# Patient Record
Sex: Male | Born: 1995 | Hispanic: Yes | Marital: Single | Smoking: Never smoker
Health system: Southern US, Community
[De-identification: ages and names within clinical notes are randomized; demographics above are authoritative.]

---

## 2019-05-27 ENCOUNTER — Emergency Department (HOSPITAL_COMMUNITY): Payer: No Typology Code available for payment source

## 2019-05-27 ENCOUNTER — Other Ambulatory Visit: Payer: Self-pay

## 2019-05-27 ENCOUNTER — Encounter (HOSPITAL_COMMUNITY): Payer: Self-pay | Admitting: Emergency Medicine

## 2019-05-27 ENCOUNTER — Emergency Department (HOSPITAL_COMMUNITY)
Admission: EM | Admit: 2019-05-27 | Discharge: 2019-05-27 | Disposition: A | Discharge status: Home / Self Care | Payer: No Typology Code available for payment source | Attending: Emergency Medicine | Admitting: Emergency Medicine

## 2019-05-27 DIAGNOSIS — Y999 Unspecified external cause status: Secondary | ICD-10-CM | POA: Diagnosis not present

## 2019-05-27 DIAGNOSIS — R404 Transient alteration of awareness: Secondary | ICD-10-CM | POA: Insufficient documentation

## 2019-05-27 DIAGNOSIS — Y939 Activity, unspecified: Secondary | ICD-10-CM | POA: Insufficient documentation

## 2019-05-27 DIAGNOSIS — R040 Epistaxis: Secondary | ICD-10-CM | POA: Diagnosis not present

## 2019-05-27 DIAGNOSIS — S81012A Laceration without foreign body, left knee, initial encounter: Secondary | ICD-10-CM

## 2019-05-27 DIAGNOSIS — Y929 Unspecified place or not applicable: Secondary | ICD-10-CM | POA: Insufficient documentation

## 2019-05-27 DIAGNOSIS — Z23 Encounter for immunization: Secondary | ICD-10-CM | POA: Diagnosis not present

## 2019-05-27 DIAGNOSIS — S82002A Unspecified fracture of left patella, initial encounter for closed fracture: Secondary | ICD-10-CM | POA: Diagnosis not present

## 2019-05-27 LAB — COMPREHENSIVE METABOLIC PANEL
ALT: 22 U/L (ref 0–44)
AST: 24 U/L (ref 15–41)
Albumin: 4.4 g/dL (ref 3.5–5.0)
Alkaline Phosphatase: 66 U/L (ref 38–126)
Anion gap: 11 (ref 5–15)
BUN: 19 mg/dL (ref 6–20)
CO2: 20 mmol/L — ABNORMAL LOW (ref 22–32)
Calcium: 9.2 mg/dL (ref 8.9–10.3)
Chloride: 106 mmol/L (ref 98–111)
Creatinine, Ser: 1.18 mg/dL (ref 0.61–1.24)
GFR calc Af Amer: >60 mL/min (ref >60)
GFR calc non Af Amer: >60 mL/min (ref >60)
Glucose, Bld: 99 mg/dL (ref 70–99)
Potassium: 3.7 mmol/L (ref 3.5–5.1)
Sodium: 137 mmol/L (ref 135–145)
Total Bilirubin: 1.3 mg/dL — ABNORMAL HIGH (ref 0.3–1.2)
Total Protein: 7.1 g/dL (ref 6.5–8.1)

## 2019-05-27 LAB — CBC WITH DIFFERENTIAL/PLATELET
Abs Immature Granulocytes: 0.03 10*3/uL (ref 0.00–0.07)
Basophils Absolute: 0 10*3/uL (ref 0.0–0.1)
Basophils Relative: 0 %
Eosinophils Absolute: 0.1 10*3/uL (ref 0.0–0.5)
Eosinophils Relative: 1 %
HCT: 42.9 % (ref 39.0–52.0)
Hemoglobin: 14.6 g/dL (ref 13.0–17.0)
Immature Granulocytes: 0 %
Lymphocytes Relative: 26 %
Lymphs Abs: 2.6 10*3/uL (ref 0.7–4.0)
MCH: 29.1 pg (ref 26.0–34.0)
MCHC: 34 g/dL (ref 30.0–36.0)
MCV: 85.6 fL (ref 80.0–100.0)
Monocytes Absolute: 0.8 10*3/uL (ref 0.1–1.0)
Monocytes Relative: 8 %
Neutro Abs: 6.5 10*3/uL (ref 1.7–7.7)
Neutrophils Relative %: 65 %
Platelets: 292 10*3/uL (ref 150–400)
RBC: 5.01 MIL/uL (ref 4.22–5.81)
RDW: 12.8 % (ref 11.5–15.5)
WBC: 10 10*3/uL (ref 4.0–10.5)
nRBC: 0 % (ref 0.0–0.2)

## 2019-05-27 MED ORDER — LIDOCAINE-EPINEPHRINE (PF) 2 %-1:200000 IJ SOLN
10.0000 mL | Freq: Once | INTRAMUSCULAR | Status: AC
  Administered 2019-05-27: 10 mL
  Filled 2019-05-27: qty 20

## 2019-05-27 MED ORDER — MORPHINE SULFATE (PF) 4 MG/ML IV SOLN
4.0000 mg | Freq: Once | INTRAVENOUS | Status: AC
  Administered 2019-05-27: 4 mg via INTRAVENOUS
  Filled 2019-05-27: qty 1

## 2019-05-27 MED ORDER — OXYCODONE-ACETAMINOPHEN 5-325 MG PO TABS
1.0000 | ORAL_TABLET | Freq: Once | ORAL | Status: AC
  Administered 2019-05-27: 1 via ORAL
  Filled 2019-05-27: qty 1

## 2019-05-27 MED ORDER — TETANUS-DIPHTH-ACELL PERTUSSIS 5-2.5-18.5 LF-MCG/0.5 IM SUSP
0.5000 mL | Freq: Once | INTRAMUSCULAR | Status: AC
  Administered 2019-05-27: 0.5 mL via INTRAMUSCULAR
  Filled 2019-05-27: qty 0.5
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
## 2019-05-27 NOTE — ED Notes (Signed)
Patient verbalizes understanding of discharge instructions. Opportunity for questioning and answers were provided. Armband removed by staff, pt discharged from ED ambulatory.   

## 2019-05-27 NOTE — Discharge Instructions (Addendum)
Please get a follow-up appointment with the orthopedic doctor to schedule recheck for your knee laceration as well as the possible patellar fracture.  You can bear weight as tolerated but please do not bear any heavy weight and be very gentle with bending your left knee as sudden strain may cause significant tension on the sutures.  They will need to be removed in 10 to 14 days.  Please keep the area clean and dry, follow wound instructions as discussed.  If you develop chest pain, difficulty breathing or other new concerning symptom please return to ER for reassessment.  Recommend Tylenol, Motrin as needed for pain control.

## 2019-05-27 NOTE — ED Notes (Signed)
Patient transported to X-ray 
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
